# Patient Record
Sex: Female | Born: 1955 | Race: White | Hispanic: No | Marital: Married | Smoking: Former smoker
Health system: Southern US, Community
[De-identification: ages and names within clinical notes are randomized; demographics above are authoritative.]

## PROBLEM LIST (undated history)

## (undated) DIAGNOSIS — M792 Neuralgia and neuritis, unspecified: Secondary | ICD-10-CM

## (undated) DIAGNOSIS — E785 Hyperlipidemia, unspecified: Secondary | ICD-10-CM

## (undated) DIAGNOSIS — M545 Low back pain, unspecified: Secondary | ICD-10-CM

## (undated) DIAGNOSIS — K5790 Diverticulosis of intestine, part unspecified, without perforation or abscess without bleeding: Secondary | ICD-10-CM

## (undated) DIAGNOSIS — K449 Diaphragmatic hernia without obstruction or gangrene: Secondary | ICD-10-CM

## (undated) DIAGNOSIS — N319 Neuromuscular dysfunction of bladder, unspecified: Secondary | ICD-10-CM

## (undated) DIAGNOSIS — F32A Depression, unspecified: Secondary | ICD-10-CM

## (undated) DIAGNOSIS — D509 Iron deficiency anemia, unspecified: Secondary | ICD-10-CM

## (undated) DIAGNOSIS — F419 Anxiety disorder, unspecified: Secondary | ICD-10-CM

## (undated) DIAGNOSIS — K219 Gastro-esophageal reflux disease without esophagitis: Secondary | ICD-10-CM

## (undated) DIAGNOSIS — E559 Vitamin D deficiency, unspecified: Secondary | ICD-10-CM

## (undated) HISTORY — PX: LUMBAR FUSION: SHX111

## (undated) HISTORY — DX: Neuralgia and neuritis, unspecified: M79.2

## (undated) HISTORY — DX: Depression, unspecified: F32.A

## (undated) HISTORY — DX: Diverticulosis of intestine, part unspecified, without perforation or abscess without bleeding: K57.90

## (undated) HISTORY — DX: Iron deficiency anemia, unspecified: D50.9

## (undated) HISTORY — DX: Gastro-esophageal reflux disease without esophagitis: K21.9

## (undated) HISTORY — PX: BILATERAL OOPHORECTOMY: SHX1221

## (undated) HISTORY — DX: Low back pain, unspecified: M54.50

## (undated) HISTORY — DX: Hyperlipidemia, unspecified: E78.5

## (undated) HISTORY — DX: Anxiety disorder, unspecified: F41.9

## (undated) HISTORY — DX: Vitamin D deficiency, unspecified: E55.9

## (undated) HISTORY — DX: Neuromuscular dysfunction of bladder, unspecified: N31.9

## (undated) HISTORY — PX: OTHER SURGICAL HISTORY: SHX169

## (undated) HISTORY — DX: Diaphragmatic hernia without obstruction or gangrene: K44.9

---

## 2020-03-15 DIAGNOSIS — M869 Osteomyelitis, unspecified: Secondary | ICD-10-CM

## 2020-03-15 DIAGNOSIS — I10 Essential (primary) hypertension: Secondary | ICD-10-CM

## 2020-03-16 DIAGNOSIS — I10 Essential (primary) hypertension: Secondary | ICD-10-CM | POA: Diagnosis not present

## 2020-03-16 DIAGNOSIS — M869 Osteomyelitis, unspecified: Secondary | ICD-10-CM | POA: Diagnosis not present

## 2020-03-17 DIAGNOSIS — I10 Essential (primary) hypertension: Secondary | ICD-10-CM | POA: Diagnosis not present

## 2020-03-17 DIAGNOSIS — M869 Osteomyelitis, unspecified: Secondary | ICD-10-CM | POA: Diagnosis not present

## 2020-03-22 ENCOUNTER — Telehealth: Payer: Self-pay

## 2020-03-22 NOTE — Telephone Encounter (Signed)
COVID-19 Pre-Screening Questions:  Do you currently have a fever (>100 F), chills or unexplained body aches? NO  Are you currently experiencing new cough, shortness of breath, sore throat, runny nose? NO .  Marland Kitchen Have you recently travelled outside the state of [place] in the last 14 days? NO 1. Have you been in contact with someone that is currently pending confirmation of Covid19 testing or has been confirmed to have the Covid19 virus?  NO

## 2020-03-26 ENCOUNTER — Encounter: Payer: Self-pay | Admitting: Internal Medicine

## 2020-03-26 ENCOUNTER — Ambulatory Visit: Payer: 59 | Admitting: Internal Medicine

## 2020-03-26 ENCOUNTER — Other Ambulatory Visit: Payer: Self-pay

## 2020-03-26 DIAGNOSIS — K219 Gastro-esophageal reflux disease without esophagitis: Secondary | ICD-10-CM | POA: Insufficient documentation

## 2020-03-26 DIAGNOSIS — F339 Major depressive disorder, recurrent, unspecified: Secondary | ICD-10-CM | POA: Diagnosis not present

## 2020-03-26 DIAGNOSIS — F32A Depression, unspecified: Secondary | ICD-10-CM | POA: Insufficient documentation

## 2020-03-26 DIAGNOSIS — M86172 Other acute osteomyelitis, left ankle and foot: Secondary | ICD-10-CM | POA: Diagnosis not present

## 2020-03-26 MED ORDER — ONDANSETRON HCL 4 MG PO TABS: 4.0000 mg | ORAL_TABLET | Freq: Three times a day (TID) | ORAL | 1 refills | Status: DC | PRN

## 2020-03-26 NOTE — Progress Notes (Signed)
[facility] for Infectious Disease  Reason for Consult: Left ankle osteomyelitis Referring Provider: Dr. Joya Salm  Assessment: She has a deep left ankle infection caused by MSSA that is complicated by osteomyelitis.  This should be much easier to cure with her hardware removed.  On at least 6 weeks of IV cefazolin through 04/26/2020.  Plan: 1. Continue cefazolin 2. Check weekly CBC, BMP, ESR and CRP 3. Ondansetron as needed for nausea 4. Follow-up here in 4 weeks  Patient Active Problem List   Diagnosis Date Noted  . Osteomyelitis of ankle, left, acute ([place]) 03/26/2020  . GERD (gastroesophageal reflux disease) 03/26/2020  . Depression 03/26/2020    Patient's Medications  New Prescriptions   ONDANSETRON (ZOFRAN) 4 MG TABLET    Take 1 tablet (4 mg total) by mouth every 8 (eight) hours as needed for nausea or vomiting.  Previous Medications   ATENOLOL (TENORMIN) 25 MG TABLET    Take 1 tablet by mouth daily.   BUPROPION (WELLBUTRIN XL) 300 MG 24 HR TABLET    Take 300 mg by mouth daily.   OXYBUTYNIN (DITROPAN XL) 15 MG 24 HR TABLET    Take 15 mg by mouth daily.   SERTRALINE (ZOLOFT) 100 MG TABLET    Take 100 mg by mouth daily.  Modified Medications   No medications on file  Discontinued Medications   No medications on file    HPI: Leslie Sanchez is a 64 y.o. female who slipped in her backyard 1 year ago suffering a left ankle fracture.  She underwent open reduction and internal fixation at the end of July.  She has had chronic swelling over the lateral ankle without associated redness, warmth or wound drainage.  She underwent a recent MRI which apparently revealed evidence of osteomyelitis.  She was hospitalized at [facility] and underwent incision and drainage with removal of all hardware on 03/15/2020.  Operative cultures grew MSSA.  A PICC was placed and she was discharged on IV cefazolin.  She has been packing the open midportion of her wound  daily.  Review of Systems: Review of Systems  Constitutional: Positive for malaise/fatigue. Negative for chills, diaphoresis and fever.  HENT: Positive for congestion and sore throat.   Respiratory: Negative for cough and shortness of breath.   Cardiovascular: Negative for chest pain.  Gastrointestinal: Positive for nausea and vomiting. Negative for abdominal pain and diarrhea.  Musculoskeletal: Positive for joint pain.  Skin: Negative for rash.  Neurological: Positive for dizziness and headaches.  Psychiatric/Behavioral: Positive for depression.      No past medical history on file.  Social History   Tobacco Use  . Smoking status: Former Smoker    Types: Cigarettes  . Smokeless tobacco: Never Used  . Tobacco comment: quit 1990  Substance Use Topics  . Alcohol use: Not Currently    Alcohol/week: 2.0 standard drinks    Types: 2 Glasses of wine per week  . Drug use: Yes    No family history on file. No Known Allergies  OBJECTIVE: Vitals:   03/26/20 0849  BP: (!) 149/89  Pulse: 84  Temp: 98.4 F (36.9 C)  TempSrc: Oral  Weight: 150 lb (68 kg)   There is no height or weight on file to calculate BMI.   Physical Exam Constitutional:      Comments: She becomes tearful when talking about what she has been through.  Cardiovascular:     Rate and Rhythm: Normal rate and  regular rhythm.  Pulmonary:     Breath sounds: Normal breath sounds.  Abdominal:     Palpations: Abdomen is soft.     Tenderness: There is no abdominal tenderness.  Musculoskeletal:     Comments: She has a small amount of serosanguineous drainage on her dressing.  The midportion of her lateral left ankle wound is open packed with a gauze.  Skin:    Findings: No rash.     Comments: Her right arm PICC site looks good.       Microbiology: No results found for this or any previous visit (from the past 240 hour(s)).  Michel Bickers, MD [facility] [phone] pager   [phone] cell 03/26/2020, 9:16 AM

## 2020-04-08 ENCOUNTER — Encounter: Payer: Self-pay | Admitting: Internal Medicine

## 2020-04-23 ENCOUNTER — Telehealth: Payer: Self-pay | Admitting: Infectious Diseases

## 2020-04-23 NOTE — Telephone Encounter (Signed)
Patient refused to be scheduled for follow up - she "sees no point since her wound has healed"    She should be completing IV Cefaozlin 8/06.

## 2020-04-24 NOTE — Telephone Encounter (Signed)
OK 

## 2020-04-26 NOTE — Telephone Encounter (Signed)
Received call today from RN at [employer] [employer]. States patient is refusing to schedule appointment with them, is requesting picc line be removed ASAP. Patient had refused appointment with our office. Will forward message to MD to advise on picc line. Lorenso Courier, [place]

## 2020-04-26 NOTE — Telephone Encounter (Signed)
Called [facility] with verbal orders to pull picc. Spoke with Enrique Sack who will need orders faxed to be scanned in chart.  Rexene Alberts, NP was able to write orders on behalf of MD. Will fax orders to [phone]. Lorenso Courier, [place]

## 2020-04-26 NOTE — Telephone Encounter (Signed)
Please have home health remove her PICC.  Thanks.

## 2020-11-08 ENCOUNTER — Telehealth: Payer: Self-pay | Admitting: *Deleted

## 2020-11-08 NOTE — Telephone Encounter (Signed)
Notes on file from [facility], Dr Maryjean Ka. [phone]. Referral sent to scheduling.

## 2020-11-25 NOTE — Progress Notes (Signed)
Cardiology Office Note:    Date:  11/28/2020   ID:  Leslie Sanchez, DOB 12-25-1955, MRN [id number]  PCP:  Street, Stephanie Coup, MD   [facility]  Cardiologist:  No primary care provider on file.  Advanced Practice Provider:  No care team member to display Electrophysiologist:  None    Referring MD: Street, Stephanie Coup, *     History of Present Illness:    Leslie Sanchez is a 65 y.o. female with a hx of anxiety, depression, HLD and GERD who was referred by Dr. Casper Harrison for further evaluation of SOB, intermittent chest pain and strong family history of CAD.  Patient states that she has some tightness in the chest and shortness of breath intermittently. She is not sure if this is anxiety related as she has been struggling a lot with her mood and depression. Symptoms happen intermittently either at rest or with exertion. Symptoms last a couple of minutes to hours before abating. No LE edema, orthopnea, PND, syncope. No bleeding issues.  Brother had CAD and underwent CABG at 23, younger brother with MI 36 and 17 MI and massive MI at 19 and passed away, father CAD with MI, aunt with MI at 40  Past Medical History:  Diagnosis Date  . Anxiety   . Depression   . Diverticulosis   . Dyslipidemia   . GERD (gastroesophageal reflux disease)   . Hiatal hernia   . Iron deficiency anemia   . Low back pain   . Neurogenic dysfunction of the urinary bladder   . Neurogenic pain   . Vitamin D deficiency      Current Medications: Current Meds  Medication Sig  . atenolol (TENORMIN) 25 MG tablet Take 1 tablet by mouth daily.  Marland Kitchen BIOTIN PO Take by mouth.  . metoprolol tartrate (LOPRESSOR) 50 MG tablet Patient to take 1 tablet by mouth 2 hours prior to procedure  . Multiple Vitamin (MULTIVITAMIN) tablet Take 1 tablet by mouth daily.  Marland Kitchen oxybutynin (DITROPAN XL) 15 MG 24 hr tablet Take 15 mg by mouth daily.     Allergies:   Penicillins   Social History    Socioeconomic History  . Marital status: Married    Spouse name: Not on file  . Number of children: Not on file  . Years of education: Not on file  . Highest education level: Not on file  Occupational History  . Not on file  Tobacco Use  . Smoking status: Former Smoker    Types: Cigarettes  . Smokeless tobacco: Never Used  . Tobacco comment: quit 1990  Substance and Sexual Activity  . Alcohol use: Not Currently    Alcohol/week: 2.0 standard drinks    Types: 2 Glasses of wine per week  . Drug use: Yes  . Sexual activity: Not Currently  Other Topics Concern  . Not on file  Social History Narrative  . Not on file   Social Determinants of Health   Financial Resource Strain: Not on file  Food Insecurity: Not on file  Transportation Needs: Not on file  Physical Activity: Not on file  Stress: Not on file  Social Connections: Not on file     Family History: The patient's family history includes Cancer - Colon in her mother; Heart attack in her father; Hypertension in her father. There is no history of Anemia, Arrhythmia, Asthma, Clotting disorder, Fainting, Heart disease, Heart failure, or Hyperlipidemia.  ROS:   Please see the history of present  illness.    Review of Systems  Constitutional: Positive for malaise/fatigue. Negative for chills and fever.  HENT: Negative for hearing loss and sore throat.   Eyes: Negative for redness.  Respiratory: Positive for shortness of breath.   Cardiovascular: Positive for chest pain. Negative for palpitations, orthopnea, claudication, leg swelling and PND.  Gastrointestinal: Negative for melena, nausea and vomiting.  Genitourinary: Negative for dysuria and flank pain.  Musculoskeletal: Positive for myalgias.  Neurological: Positive for dizziness. Negative for loss of consciousness.  Endo/Heme/Allergies: Negative for polydipsia.  Psychiatric/Behavioral: Positive for depression. The patient is nervous/anxious.     EKGs/Labs/Other  Studies Reviewed:    The following studies were reviewed today: No cardiac studies  EKG:  EKG is  ordered today.  The ekg ordered today demonstrates NSR with borderline rsr' pattern with HR 67  Recent Labs: No results found for requested labs within last 8760 hours.  Recent Lipid Panel No results found for: CHOL, TRIG, HDL, CHOLHDL, VLDL, LDLCALC, LDLDIRECT    Physical Exam:    VS:  BP 128/84   Pulse 67   Ht 5' 5.25" (1.657 m)   Wt 156 lb 3.2 oz (70.9 kg)   SpO2 96%   BMI 25.79 kg/m     Wt Readings from Last 3 Encounters:  11/28/20 156 lb 3.2 oz (70.9 kg)  03/26/20 150 lb (68 kg)     GEN:  Well nourished, well developed in no acute distress HEENT: Normal NECK: No JVD; No carotid bruits CARDIAC: RRR, no murmurs, rubs, gallops RESPIRATORY:  Clear to auscultation without rales, wheezing or rhonchi  ABDOMEN: Soft, non-tender, non-distended MUSCULOSKELETAL:  No edema; No deformity  SKIN: Warm and dry NEUROLOGIC:  Alert and oriented x 3 PSYCHIATRIC:  Very tearful on examination  ASSESSMENT:    1. Chest pain, unspecified type   2. Family history of premature CAD   3. Dyspnea on exertion   4. Anxiety   5. Episode of recurrent major depressive disorder, unspecified depression episode severity (HCC)    PLAN:    In order of problems listed above:  #DOE: #Chest Pain: #Family History of CAD: Patient with intermittent shortness of breath and chest tightness. Can occur at rest and with exertion. Notably, patient is suffering from severe depression and anxiety and symptoms have worsened since that time. Has a very strong family history of CAD with multiple siblings with MI in 30-40s. Given history and symptoms, will check coronary CTA. -Check coronary CTA  #HTN: Controlled today. -Continue atenolol  #Anxiety  #Depression: Patient very tearful during exam today and has been struggling significantly with her mood. She is followed by Dr. Casper HarrisonStreet who has been very  helpful. -Continue management per PCP      Medication Adjustments/Labs and Tests Ordered: Current medicines are reviewed at length with the patient today.  Concerns regarding medicines are outlined above.  Orders Placed This Encounter  Procedures  . CT CORONARY MORPH W/CTA COR W/SCORE W/CA W/CM &/OR WO/CM  . CT CORONARY FRACTIONAL FLOW RESERVE DATA PREP  . CT CORONARY FRACTIONAL FLOW RESERVE FLUID ANALYSIS  . EKG 12-Lead   Meds ordered this encounter  Medications  . metoprolol tartrate (LOPRESSOR) 50 MG tablet    Sig: Patient to take 1 tablet by mouth 2 hours prior to procedure    Dispense:  1 tablet    Refill:  0    Patient Instructions  Medication Instructions:  Your physician recommends that you continue on your current medications as directed. Please refer to  the Current Medication list given to you today.  *If you need a refill on your cardiac medications before your next appointment, please call your pharmacy*   Lab Work: BMET prior to Coronary CTA If you have labs (blood work) drawn today and your tests are completely normal, you will receive your results only by: Marland Kitchen MyChart Message (if you have MyChart) OR . A paper copy in the mail If you have any lab test that is abnormal or we need to change your treatment, we will call you to review the results.   Testing/Procedures: .Your cardiac CT will be scheduled at one of the below locations:   [facility] [address] [place], [place] [address] [phone]    If scheduled at [facility] please arrive at the [facility] main entrance (entrance A) of [facility] 30 minutes prior to test start time. Proceed to the [facility] Radiology Department (first floor) to check-in and test prep.    Please follow these instructions carefully (unless otherwise directed):   On the Night Before the Test: . Be sure to Drink plenty of water. . Do not consume any caffeinated/decaffeinated  beverages or chocolate 12 hours prior to your test. . Do not take any antihistamines 12 hours prior to your test.  On the Day of the Test: . Drink plenty of water until 1 hour prior to the test. . Do not eat any food 4 hours prior to the test. . You may take your regular medications prior to the test.  . Take metoprolol (Lopressor) two hours prior to test. . HOLD Furosemide/Hydrochlorothiazide morning of the test. . FEMALES- please wear underwire-free bra if available   ).       After the Test: . Drink plenty of water. . After receiving IV contrast, you may experience a mild flushed feeling. This is normal. . On occasion, you may experience a mild rash up to 24 hours after the test. This is not dangerous. If this occurs, you can take Benadryl 25 mg and increase your fluid intake. . If you experience trouble breathing, this can be serious. If it is severe call 911 IMMEDIATELY. If it is mild, please call our office. . If you take any of these medications: Glipizide/Metformin, Avandament, Glucavance, please do not take 48 hours after completing test unless otherwise instructed.   Once we have confirmed authorization from your insurance company, we will call you to set up a date and time for your test. Based on how quickly your insurance processes prior authorizations requests, please allow up to 4 weeks to be contacted for scheduling your Cardiac CT appointment. Be advised that routine Cardiac CT appointments could be scheduled as many as 8 weeks after your provider has ordered it.  For non-scheduling related questions, please contact the cardiac imaging nurse navigator should you have any questions/concerns: Rockwell Alexandria, Cardiac Imaging Nurse Navigator Larey Brick, Cardiac Imaging Nurse Navigator [facility] Direct Office Dial: [phone]   For scheduling needs, including cancellations and rescheduling, please call [place],  [phone].     Follow-Up: At [facility], you and your health needs are our priority.  As part of our continuing mission to provide you with exceptional heart care, we have created designated Provider Care Teams.  These Care Teams include your primary Cardiologist (physician) and Advanced Practice Providers (APPs -  Physician Assistants and Nurse Practitioners) who all work together to provide you with the care you need, when you  need it.  We recommend signing up for the patient portal called "MyChart".  Sign up information is provided on this After Visit Summary.  MyChart is used to connect with patients for Virtual Visits (Telemedicine).  Patients are able to view lab/test results, encounter notes, upcoming appointments, etc.  Non-urgent messages can be sent to your provider as well.   To learn more about what you can do with MyChart, go to [email or web address].    Your next appointment:   6 month(s)  The format for your next appointment:   In Person  Provider:   You may see Dr. Laurance Flatten or one of the following Advanced Practice Providers on your designated Care Team:    Tereso Newcomer, PA-C  [place], [place]    Other Instructions  Hold atenolol the day of but can take that night; Take Metoprolol Tartrate Lopressor 1 tab 2 hours prior to procedure     Signed, Meriam Sprague, MD  11/28/2020 3:55 PM    [facility]

## 2020-11-28 ENCOUNTER — Encounter: Payer: Self-pay | Admitting: Cardiology

## 2020-11-28 ENCOUNTER — Other Ambulatory Visit: Payer: Self-pay

## 2020-11-28 ENCOUNTER — Ambulatory Visit: Payer: 59 | Admitting: Cardiology

## 2020-11-28 VITALS — BP 128/84 | HR 67 | Ht 65.25 in | Wt 156.2 lb

## 2020-11-28 DIAGNOSIS — Z8249 Family history of ischemic heart disease and other diseases of the circulatory system: Secondary | ICD-10-CM | POA: Diagnosis not present

## 2020-11-28 DIAGNOSIS — F339 Major depressive disorder, recurrent, unspecified: Secondary | ICD-10-CM

## 2020-11-28 DIAGNOSIS — R079 Chest pain, unspecified: Secondary | ICD-10-CM | POA: Diagnosis not present

## 2020-11-28 DIAGNOSIS — F419 Anxiety disorder, unspecified: Secondary | ICD-10-CM | POA: Diagnosis not present

## 2020-11-28 DIAGNOSIS — R0609 Other forms of dyspnea: Secondary | ICD-10-CM

## 2020-11-28 DIAGNOSIS — R0600 Dyspnea, unspecified: Secondary | ICD-10-CM | POA: Diagnosis not present

## 2020-11-28 MED ORDER — METOPROLOL TARTRATE 50 MG PO TABS: ORAL_TABLET | ORAL | 0 refills | Status: AC

## 2020-11-28 NOTE — Patient Instructions (Addendum)
Medication Instructions:  Your physician recommends that you continue on your current medications as directed. Please refer to the Current Medication list given to you today.  *If you need a refill on your cardiac medications before your next appointment, please call your pharmacy*   Lab Work: BMET prior to Coronary CTA If you have labs (blood work) drawn today and your tests are completely normal, you will receive your results only by: Marland Kitchen MyChart Message (if you have MyChart) OR . A paper copy in the mail If you have any lab test that is abnormal or we need to change your treatment, we will call you to review the results.   Testing/Procedures: .Your cardiac CT will be scheduled at one of the below locations:   [facility] [address] [place], [place] [address] [phone]    If scheduled at [facility], please arrive at the [facility] main entrance (entrance A) of [facility] 30 minutes prior to test start time. Proceed to the [facility] Radiology Department (first floor) to check-in and test prep.    Please follow these instructions carefully (unless otherwise directed):   On the Night Before the Test: . Be sure to Drink plenty of water. . Do not consume any caffeinated/decaffeinated beverages or chocolate 12 hours prior to your test. . Do not take any antihistamines 12 hours prior to your test.  On the Day of the Test: . Drink plenty of water until 1 hour prior to the test. . Do not eat any food 4 hours prior to the test. . You may take your regular medications prior to the test.  . Take metoprolol (Lopressor) two hours prior to test. . HOLD Furosemide/Hydrochlorothiazide morning of the test. . FEMALES- please wear underwire-free bra if available   ).       After the Test: . Drink plenty of water. . After receiving IV contrast, you may experience a mild flushed feeling. This is normal. . On occasion, you may experience a mild  rash up to 24 hours after the test. This is not dangerous. If this occurs, you can take Benadryl 25 mg and increase your fluid intake. . If you experience trouble breathing, this can be serious. If it is severe call 911 IMMEDIATELY. If it is mild, please call our office. . If you take any of these medications: Glipizide/Metformin, Avandament, Glucavance, please do not take 48 hours after completing test unless otherwise instructed.   Once we have confirmed authorization from your insurance company, we will call you to set up a date and time for your test. Based on how quickly your insurance processes prior authorizations requests, please allow up to 4 weeks to be contacted for scheduling your Cardiac CT appointment. Be advised that routine Cardiac CT appointments could be scheduled as many as 8 weeks after your provider has ordered it.  For non-scheduling related questions, please contact the cardiac imaging nurse navigator should you have any questions/concerns: Rockwell Alexandria, Cardiac Imaging Nurse Navigator Larey Brick, Cardiac Imaging Nurse Navigator [facility] Direct Office Dial: [phone]   For scheduling needs, including cancellations and rescheduling, please call [place], [phone].     Follow-Up: At [facility], you and your health needs are our priority.  As part of our continuing mission to provide you with exceptional heart care, we have created designated Provider Care Teams.  These Care Teams include your primary Cardiologist (physician) and Advanced Practice Providers (APPs -  Physician Assistants  and Nurse Practitioners) who all work together to provide you with the care you need, when you need it.  We recommend signing up for the patient portal called "MyChart".  Sign up information is provided on this After Visit Summary.  MyChart is used to connect with patients for Virtual Visits (Telemedicine).  Patients are able to view lab/test  results, encounter notes, upcoming appointments, etc.  Non-urgent messages can be sent to your provider as well.   To learn more about what you can do with MyChart, go to [email or web address].    Your next appointment:   6 month(s)  The format for your next appointment:   In Person  Provider:   You may see Dr. Laurance Flatten or one of the following Advanced Practice Providers on your designated Care Team:    Tereso Newcomer, PA-C  [place], [place]    Other Instructions  Hold atenolol the day of but can take that night; Take Metoprolol Tartrate Lopressor 1 tab 2 hours prior to procedure

## 2020-12-19 ENCOUNTER — Telehealth: Payer: Self-pay

## 2020-12-19 ENCOUNTER — Encounter: Payer: Self-pay | Admitting: Cardiology

## 2020-12-19 DIAGNOSIS — Z01812 Encounter for preprocedural laboratory examination: Secondary | ICD-10-CM

## 2020-12-19 NOTE — Telephone Encounter (Signed)
error 

## 2020-12-19 NOTE — Telephone Encounter (Signed)
BMET scheduled for 4/6. Orders placed.

## 2020-12-19 NOTE — Telephone Encounter (Signed)
Pt called back in returning Darl Pikes call about the BMET .    Best number is [phone]

## 2020-12-19 NOTE — Telephone Encounter (Signed)
RN left a message for pt to call back, RN called in regards to needing to schedule a BMET lab.

## 2020-12-25 ENCOUNTER — Telehealth (HOSPITAL_COMMUNITY): Payer: Self-pay | Admitting: Emergency Medicine

## 2020-12-25 ENCOUNTER — Other Ambulatory Visit: Payer: 59 | Admitting: *Deleted

## 2020-12-25 ENCOUNTER — Other Ambulatory Visit: Payer: Self-pay

## 2020-12-25 DIAGNOSIS — Z01812 Encounter for preprocedural laboratory examination: Secondary | ICD-10-CM

## 2020-12-25 NOTE — Telephone Encounter (Signed)
Reaching out to patient to offer assistance regarding upcoming cardiac imaging study; pt verbalizes understanding of appt date/time, parking situation and where to check in, pre-test NPO status and medications ordered, and verified current allergies; name and call back number provided for further questions should they arise Inger Wiest RN Navigator Cardiac Imaging [facility] [phone] office [phone] cell 

## 2020-12-26 LAB — BASIC METABOLIC PANEL
BUN/Creatinine Ratio: 13 (ref 12–28)
BUN: 14 mg/dL (ref 8–27)
CO2: 21 mmol/L (ref 20–29)
Calcium: 9.6 mg/dL (ref 8.7–10.3)
Chloride: 105 mmol/L (ref 96–106)
Creatinine, Ser: 1.12 mg/dL — ABNORMAL HIGH (ref 0.57–1.00)
Glucose: 94 mg/dL (ref 65–99)
Potassium: 4.5 mmol/L (ref 3.5–5.2)
Sodium: 143 mmol/L (ref 134–144)
eGFR: 55 mL/min/{1.73_m2} — ABNORMAL LOW (ref >59)

## 2020-12-27 ENCOUNTER — Ambulatory Visit (HOSPITAL_COMMUNITY)
Admission: RE | Admit: 2020-12-27 | Discharge: 2020-12-27 | Disposition: A | Discharge status: Home / Self Care | Payer: 59 | Source: Ambulatory Visit | Attending: Cardiology | Admitting: Cardiology

## 2020-12-27 ENCOUNTER — Other Ambulatory Visit: Payer: Self-pay

## 2020-12-27 DIAGNOSIS — R079 Chest pain, unspecified: Secondary | ICD-10-CM

## 2020-12-27 DIAGNOSIS — Z006 Encounter for examination for normal comparison and control in clinical research program: Secondary | ICD-10-CM

## 2020-12-27 MED ORDER — IOHEXOL 350 MG/ML SOLN
90.0000 mL | Freq: Once | INTRAVENOUS | Status: AC | PRN
  Administered 2020-12-27: 90 mL via INTRAVENOUS

## 2020-12-27 MED ORDER — NITROGLYCERIN 0.4 MG SL SUBL
0.8000 mg | SUBLINGUAL_TABLET | Freq: Once | SUBLINGUAL | Status: AC
  Administered 2020-12-27: 0.8 mg via SUBLINGUAL

## 2020-12-27 MED ORDER — NITROGLYCERIN 0.4 MG SL SUBL
SUBLINGUAL_TABLET | SUBLINGUAL | Status: AC
  Filled 2020-12-27: qty 2

## 2020-12-27 NOTE — Progress Notes (Signed)
CT scan completed. Tolerated well. D/C home with husband. Awake and alert, in no distress

## 2020-12-27 NOTE — Research (Signed)
IDENTIFY Informed Consent                  Subject Name: Leslie Sanchez   Subject met inclusion and exclusion criteria.  The informed consent form, study requirements and expectations were reviewed with the subject and questions and concerns were addressed prior to the signing of the consent form.  The subject verbalized understanding of the trial requirements.  The subject agreed to participate in the IDENTIFY trial and signed the informed consent at 12:07PM on 12/27/20.  The informed consent was obtained prior to performance of any protocol-specific procedures for the subject.  A copy of the signed informed consent was given to the subject and a copy was placed in the subject's medical record.   Sallye Ober , Research Assistant

## 2021-05-05 ENCOUNTER — Ambulatory Visit: Payer: 59 | Admitting: Cardiology

## 2022-10-13 IMAGING — CT CT HEART MORP W/ CTA COR W/ SCORE W/ CA W/CM &/OR W/O CM
4 of 7 series · 8 of 20 positions shown, 9 images · IV contrast (APPLIED)
Comparison: None.
COMPARISON: None.

Addendum:
EXAM:
OVER-READ INTERPRETATION  CT CHEST

The following report is an over-read performed by radiologist Dr.
Rantona Bhebhe [REDACTED] on 12/27/2020. This
over-read does not include interpretation of cardiac or coronary
anatomy or pathology. The coronary calcium score/coronary CTA
interpretation by the cardiologist is attached.
CLINICAL DATA: 65 Year old White Female
Cardiac/Coronary  CTA
TECHNIQUE: The patient was scanned on a [employer] scanner.

[Series 6: best diast 71 % · axial · 0.39mm/px · z∈[+1196,+1236]mm · 2 of 303 slices shown]
[im 101/303  vessel]
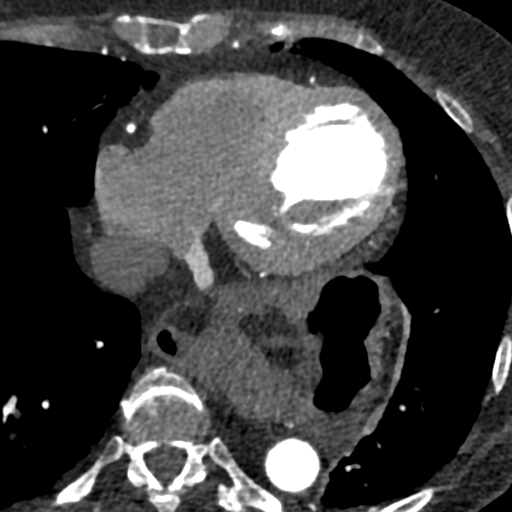
[im 202/303  vessel]
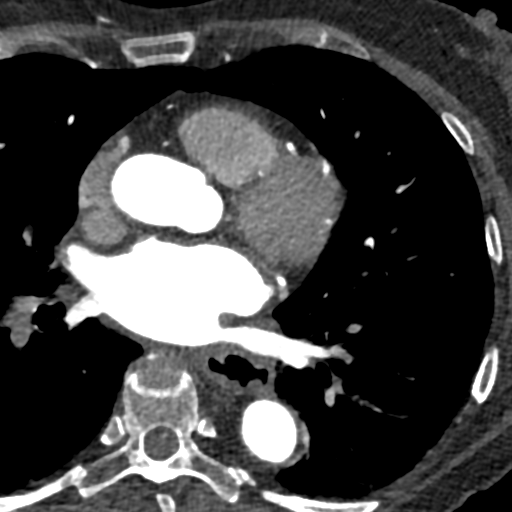

[Series 7: best syst · axial · 0.39mm/px · z∈[+1196,+1236]mm · 2 of 303 slices shown, 3 images]
[im 101/303  vessel]
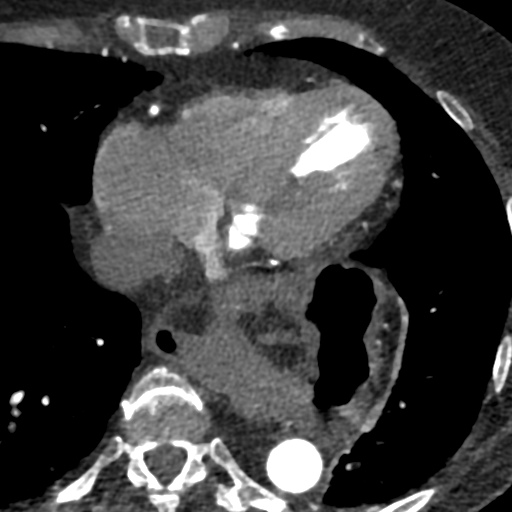
[im 101/303  lung]
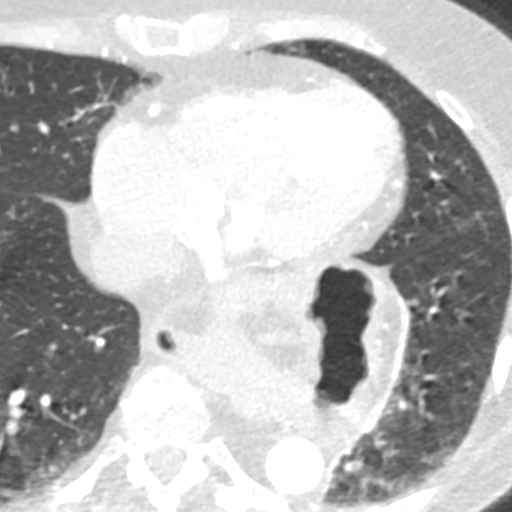
[im 202/303  vessel]
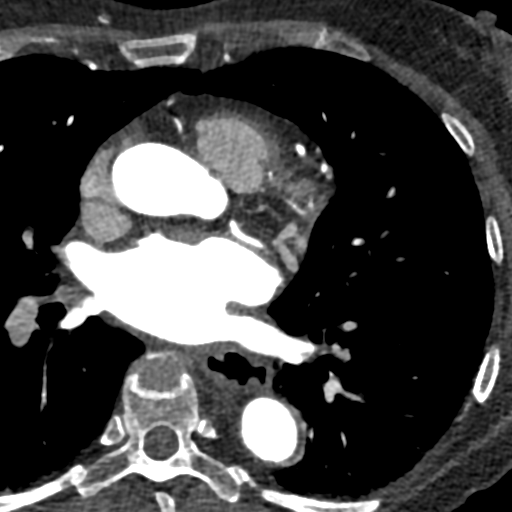

[Series 8: ts diast sharp 71 % · axial · 0.39mm/px · z∈[+1196,+1236]mm · 2 of 303 slices shown]
[im 101/303  lung]
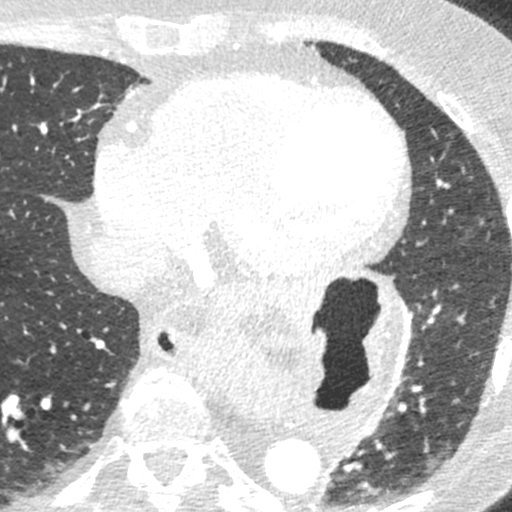
[im 202/303  lung]
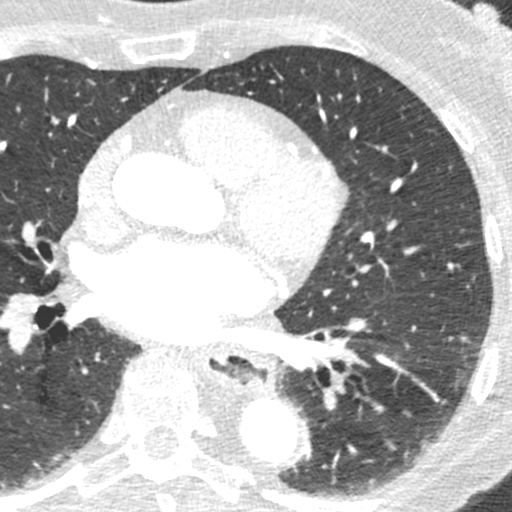

[Series 9: ts syst sharp · axial · 0.39mm/px · z∈[+1196,+1236]mm · 2 of 303 slices shown]
[im 101/303  lung]
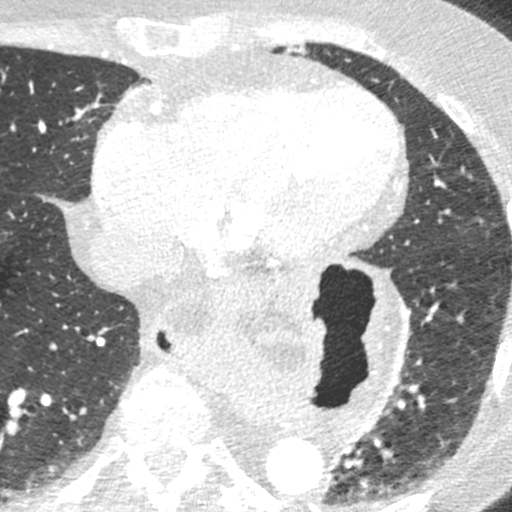
[im 202/303  lung]
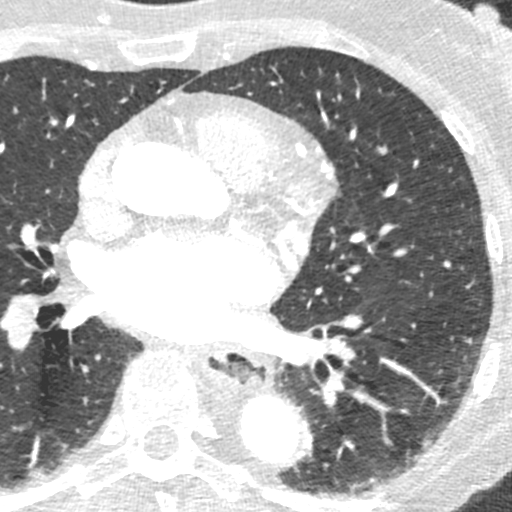

[8 of 20 positions shown; findings below may reference images not displayed]

FINDINGS: Large hiatal hernia. Within the visualized portions of the thorax
there are no suspicious appearing pulmonary nodules or masses, there
is no acute consolidative airspace disease, no pleural effusions, no
pneumothorax and no lymphadenopathy. Visualized portions of the
upper abdomen are unremarkable. There are no aggressive appearing
lytic or blastic lesions noted in the visualized portions of the
skeleton.
IMPRESSION: 1. Large hiatal hernia.
FINDINGS: A 100 kV prospective scan was triggered in the descending thoracic
aorta at 111 HU's. Axial non-contrast 3 mm slices were carried out
through the heart. The data set was analyzed on a dedicated work
station and scored using the Agatson method. Gantry rotation speed
was 250 msecs and collimation was .6 mm. No beta blockade and 0.8 mg
of sl NTG was given. The 3D data set was reconstructed in 5%
intervals of the 67-82 % of the R-R cycle. Diastolic phases were
analyzed on a dedicated work station using MPR, MIP and VRT modes.
The patient received 90 cc of contrast.

Aorta:  Normal size.  No calcifications.  No dissection.

Aortic Valve:  Tri-leaflet.  No calcifications.

Coronary Arteries:  Normal coronary origin.  Right dominance.

Coronary Calcium Score:

Left main: 6

Left anterior descending artery: 12

Left circumflex artery: 0

Right coronary artery: 0

Total: 18

Percentile: 66th for age, sex, and race matched control.

RCA is a large dominant artery that gives rise to PDA and PLA. There
is no plaque.

Left main is a large artery that gives rise to LAD and LCX arteries.
There <10% small calcification at the left main ostium.

LAD is a large vessel that gives rise to one large D1 Branch that
bifurcates. There are two minimal non-obstructive (1-24%) calcified
plaques in the proximal vessel. There is a minimal non-obstructive
(1-24%) calcified plaque in the mid vessel.

LCX is a non-dominant artery that gives rise to one small OM1
vessel. There is no plaque.

Other findings:

Atypical pulmonary vein drainage into the left atrium: Right middle
pulmonary vein.

Normal left atrial appendage without a thrombus.

Normal size of the pulmonary artery.

Large hiatal hernia noted.

Extra-cardiac findings: See attached radiology report for
non-cardiac structures.
IMPRESSION: 1. Coronary calcium score of 18. This was 66th percentile for age,
sex, and race matched control.

2. Normal coronary origin.  Right dominance.

3. CAD-RADS 1. Minimal non-obstructive CAD (1-24%). Consider
non-atherosclerotic causes of chest pain. Consider preventive
therapy and risk factor modification.

4. Atypical pulmonary vein drainage into the left atrium: Right
middle pulmonary vein.

5. Large hiatal hernia noted.

RECOMMENDATIONS:

Coronary artery calcium (CAC) score is a strong predictor of
incident coronary heart disease (CHD) and provides predictive
information beyond traditional risk factors. CAC scoring is
reasonable to use in the decision to withhold, postpone, or initiate
statin therapy in intermediate-risk or selected borderline-risk
asymptomatic adults (age 40-75 years and LDL-C ?70 to <190 mg/dL)
who do not have diabetes or established atherosclerotic
cardiovascular disease (ASCVD).* In intermediate-risk (10-year ASCVD
risk ?7.5% to <20%) adults or selected borderline-risk (10-year
ASCVD risk ?5% to <7.5%) adults in whom a CAC score is measured for
the purpose of making a treatment decision the following
recommendations have been made:

If CAC = 0, it is reasonable to withhold statin therapy and reassess
in 5 to 10 years, as long as higher risk conditions are absent
(diabetes mellitus, family history of premature CHD in first degree
relatives (males <55 years; females <65 years), cigarette smoking,
LDL ?190 mg/dL or other independent risk factors).

If CAC is 1 to 99, it is reasonable to initiate statin therapy for
patients ?55 years of age.

If CAC is ?100 or ?75th percentile, it is reasonable to initiate
statin therapy at any age.

Cardiology referral should be considered for patients with CAC
scores ?400 or ?75th percentile.

*1683 AHA/ACC/AACVPR/AAPA/ABC/GUDO/SITISEN/TIGER/Felner/PARASHUMTI/TIGER/RTOYOTA
Guideline on the Management of Blood Cholesterol: A Report of the
American College of Cardiology/American Heart Association Task Force
on Clinical Practice Guidelines. J Am Coll Cardiol.
4528;73(24):7018-7483.

*** End of Addendum ***
EXAM:
OVER-READ INTERPRETATION  CT CHEST

The following report is an over-read performed by radiologist Dr.
Rantona Bhebhe [REDACTED] on 12/27/2020. This
over-read does not include interpretation of cardiac or coronary
anatomy or pathology. The coronary calcium score/coronary CTA
interpretation by the cardiologist is attached.
FINDINGS: Large hiatal hernia. Within the visualized portions of the thorax
there are no suspicious appearing pulmonary nodules or masses, there
is no acute consolidative airspace disease, no pleural effusions, no
pneumothorax and no lymphadenopathy. Visualized portions of the
upper abdomen are unremarkable. There are no aggressive appearing
lytic or blastic lesions noted in the visualized portions of the
skeleton.
IMPRESSION: 1. Large hiatal hernia.
